# Patient Record
Sex: Male | Born: 1995 | Race: Black or African American | Hispanic: No | Marital: Single | Smoking: Current every day smoker
Health system: Southern US, Community
[De-identification: ages and names within clinical notes are randomized; demographics above are authoritative.]

---
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
## 2014-10-10 ENCOUNTER — Emergency Department (HOSPITAL_COMMUNITY)
Admission: EM | Admit: 2014-10-10 | Discharge: 2014-10-10 | Disposition: A | Discharge status: Home / Self Care | Payer: Managed Care, Other (non HMO) | Attending: Emergency Medicine | Admitting: Emergency Medicine

## 2014-10-10 ENCOUNTER — Encounter (HOSPITAL_COMMUNITY): Payer: Self-pay | Admitting: Emergency Medicine

## 2014-10-10 ENCOUNTER — Emergency Department (HOSPITAL_COMMUNITY): Payer: Managed Care, Other (non HMO)

## 2014-10-10 DIAGNOSIS — Y92410 Unspecified street and highway as the place of occurrence of the external cause: Secondary | ICD-10-CM | POA: Insufficient documentation

## 2014-10-10 DIAGNOSIS — Y9389 Activity, other specified: Secondary | ICD-10-CM | POA: Insufficient documentation

## 2014-10-10 DIAGNOSIS — S242XXA Injury of nerve root of thoracic spine, initial encounter: Secondary | ICD-10-CM | POA: Insufficient documentation

## 2014-10-10 DIAGNOSIS — S8392XA Sprain of unspecified site of left knee, initial encounter: Secondary | ICD-10-CM

## 2014-10-10 DIAGNOSIS — M546 Pain in thoracic spine: Secondary | ICD-10-CM

## 2014-10-10 DIAGNOSIS — S8992XA Unspecified injury of left lower leg, initial encounter: Secondary | ICD-10-CM | POA: Diagnosis present

## 2014-10-10 MED ORDER — NAPROXEN 500 MG PO TABS: 500.0000 mg | ORAL_TABLET | Freq: Two times a day (BID) | ORAL | Status: AC
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
## 2014-10-10 NOTE — ED Provider Notes (Signed)
Medical screening examination/treatment/procedure(s) were performed by non-physician practitioner and as supervising physician I was immediately available for consultation/collaboration.   EKG Interpretation None        Dannica Bickham F Quashon Jesus, MD 10/10/14 1859 

## 2014-10-10 NOTE — Discharge Instructions (Signed)
Ice your knee several times a day. Elevate. Knee sleep for support and compression. Try heat on your back. Follow up with orthopedics if not improving after 1 wk of rest. X-rays normal today.   Knee Sprain A knee sprain is a tear in one of the strong, fibrous tissues that connect the bones (ligaments) in your knee. The severity of the sprain depends on how much of the ligament is torn. The tear can be either partial or complete. CAUSES  Often, sprains are a result of a fall or injury. The force of the impact causes the fibers of your ligament to stretch too much. This excess tension causes the fibers of your ligament to tear. SIGNS AND SYMPTOMS  You may have some loss of motion in your knee. Other symptoms include:  Bruising.  Pain in the knee area.  Tenderness of the knee to the touch.  Swelling. DIAGNOSIS  To diagnose a knee sprain, your health care provider will physically examine your knee. Your health care provider may also suggest an X-ray exam of your knee to make sure no bones are broken. TREATMENT  If your ligament is only partially torn, treatment usually involves keeping the knee in a fixed position (immobilization) or bracing your knee for activities that require movement for several weeks. To do this, your health care provider will apply a bandage, cast, or splint to keep your knee from moving and to support your knee during movement until it heals. For a partially torn ligament, the healing process usually takes 4-6 weeks. If your ligament is completely torn, depending on which ligament it is, you may need surgery to reconnect the ligament to the bone or reconstruct it. After surgery, a cast or splint may be applied and will need to stay on your knee for 4-6 weeks while your ligament heals. HOME CARE INSTRUCTIONS  Keep your injured knee elevated to decrease swelling.  To ease pain and swelling, apply ice to the injured area:  Put ice in a plastic bag.  Place a towel between  your skin and the bag.  Leave the ice on for 20 minutes, 2-3 times a day.  Only take medicine for pain as directed by your health care provider.  Do not leave your knee unprotected until pain and stiffness go away (usually 4-6 weeks).  If you have a cast or splint, do not allow it to get wet. If you have been instructed not to remove it, cover it with a plastic bag when you shower or bathe. Do not swim.  Your health care provider may suggest exercises for you to do during your recovery to prevent or limit permanent weakness and stiffness. SEEK IMMEDIATE MEDICAL CARE IF:  Your cast or splint becomes damaged.  Your pain becomes worse.  You have significant pain, swelling, or numbness below the cast or splint. MAKE SURE YOU:  Understand these instructions.  Will watch your condition.  Will get help right away if you are not doing well or get worse. Document Released: 12/08/2005 Document Revised: 09/28/2013 Document Reviewed: 07/20/2013 [facility] Patient Information 2015 [place], [place]. This information is not intended to replace advice given to you by your health care provider. Make sure you discuss any questions you have with your health care provider.  Muscle Strain A muscle strain is an injury that occurs when a muscle is stretched beyond its normal length. Usually a small number of muscle fibers are torn when this happens. Muscle strain is rated in degrees. First-degree strains have  the least amount of muscle fiber tearing and pain. Second-degree and third-degree strains have increasingly more tearing and pain.  Usually, recovery from muscle strain takes 1-2 weeks. Complete healing takes 5-6 weeks.  CAUSES  Muscle strain happens when a sudden, violent force placed on a muscle stretches it too far. This may occur with lifting, sports, or a fall.  RISK FACTORS Muscle strain is especially common in athletes.  SIGNS AND SYMPTOMS At the site of the muscle strain, there may  be:  Pain.  Bruising.  Swelling.  Difficulty using the muscle due to pain or lack of normal function. DIAGNOSIS  Your health care provider will perform a physical exam and ask about your medical history. TREATMENT  Often, the best treatment for a muscle strain is resting, icing, and applying cold compresses to the injured area.  HOME CARE INSTRUCTIONS   Use the PRICE method of treatment to promote muscle healing during the first 2-3 days after your injury. The PRICE method involves:  Protecting the muscle from being injured again.  Restricting your activity and resting the injured body part.  Icing your injury. To do this, put ice in a plastic bag. Place a towel between your skin and the bag. Then, apply the ice and leave it on from 15-20 minutes each hour. After the third day, switch to moist heat packs.  Apply compression to the injured area with a splint or elastic bandage. Be careful not to wrap it too tightly. This may interfere with blood circulation or increase swelling.  Elevate the injured body part above the level of your heart as often as you can.  Only take over-the-counter or prescription medicines for pain, discomfort, or fever as directed by your health care provider.  Warming up prior to exercise helps to prevent future muscle strains. SEEK MEDICAL CARE IF:   You have increasing pain or swelling in the injured area.  You have numbness, tingling, or a significant loss of strength in the injured area. MAKE SURE YOU:   Understand these instructions.  Will watch your condition.  Will get help right away if you are not doing well or get worse. Document Released: 12/08/2005 Document Revised: 09/28/2013 Document Reviewed: 07/07/2013 [facility] Patient Information 2015 [place], [place]. This information is not intended to replace advice given to you by your health care provider. Make sure you discuss any questions you have with your health care provider.
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
## 2015-07-21 ENCOUNTER — Emergency Department (HOSPITAL_COMMUNITY)
Admission: EM | Admit: 2015-07-21 | Discharge: 2015-07-21 | Disposition: A | Discharge status: Home / Self Care | Payer: Managed Care, Other (non HMO) | Attending: Emergency Medicine | Admitting: Emergency Medicine

## 2015-07-21 ENCOUNTER — Encounter (HOSPITAL_COMMUNITY): Payer: Self-pay | Admitting: Emergency Medicine

## 2015-07-21 DIAGNOSIS — K644 Residual hemorrhoidal skin tags: Secondary | ICD-10-CM | POA: Insufficient documentation

## 2015-07-21 DIAGNOSIS — Z720 Tobacco use: Secondary | ICD-10-CM | POA: Insufficient documentation

## 2015-07-21 DIAGNOSIS — Z862 Personal history of diseases of the blood and blood-forming organs and certain disorders involving the immune mechanism: Secondary | ICD-10-CM | POA: Insufficient documentation

## 2015-07-21 DIAGNOSIS — Z791 Long term (current) use of non-steroidal anti-inflammatories (NSAID): Secondary | ICD-10-CM | POA: Insufficient documentation

## 2015-07-21 DIAGNOSIS — Z880 Allergy status to penicillin: Secondary | ICD-10-CM | POA: Insufficient documentation

## 2015-07-21 MED ORDER — HYDROCORTISONE 2.5 % RE CREA: TOPICAL_CREAM | RECTAL | Status: AC
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
## 2015-07-21 NOTE — Discharge Instructions (Signed)
Hemorrhoids °Hemorrhoids are swollen veins around the rectum or anus. There are two types of hemorrhoids:  °· Internal hemorrhoids. These occur in the veins just inside the rectum. They may poke through to the outside and become irritated and painful. °· External hemorrhoids. These occur in the veins outside the anus and can be felt as a painful swelling or hard lump near the anus. °CAUSES °· Pregnancy.   °· Obesity.   °· Constipation or diarrhea.   °· Straining to have a bowel movement.   °· Sitting for long periods on the toilet. °· Heavy lifting or other activity that caused you to strain. °· Anal intercourse. °SYMPTOMS  °· Pain.   °· Anal itching or irritation.   °· Rectal bleeding.   °· Fecal leakage.   °· Anal swelling.   °· One or more lumps around the anus.   °DIAGNOSIS  °Your caregiver may be able to diagnose hemorrhoids by visual examination. Other examinations or tests that may be performed include:  °· Examination of the rectal area with a gloved hand (digital rectal exam).   °· Examination of anal canal using a small tube (scope).   °· A blood test if you have lost a significant amount of blood. °· A test to look inside the colon (sigmoidoscopy or colonoscopy). °TREATMENT °Most hemorrhoids can be treated at home. However, if symptoms do not seem to be getting better or if you have a lot of rectal bleeding, your caregiver may perform a procedure to help make the hemorrhoids get smaller or remove them completely. Possible treatments include:  °· Placing a rubber band at the base of the hemorrhoid to cut off the circulation (rubber band ligation).   °· Injecting a chemical to shrink the hemorrhoid (sclerotherapy).   °· Using a tool to burn the hemorrhoid (infrared light therapy).   °· Surgically removing the hemorrhoid (hemorrhoidectomy).   °· Stapling the hemorrhoid to block blood flow to the tissue (hemorrhoid stapling).   °HOME CARE INSTRUCTIONS  °· Eat foods with fiber, such as whole grains, beans,  nuts, fruits, and vegetables. Ask your doctor about taking products with added fiber in them (fiber supplements). °· Increase fluid intake. Drink enough water and fluids to keep your urine clear or pale yellow.   °· Exercise regularly.   °· Go to the bathroom when you have the urge to have a bowel movement. Do not wait.   °· Avoid straining to have bowel movements.   °· Keep the anal area dry and clean. Use wet toilet paper or moist towelettes after a bowel movement.   °· Medicated creams and suppositories may be used or applied as directed.   °· Only take over-the-counter or prescription medicines as directed by your caregiver.   °· Take warm sitz baths for 15-20 minutes, 3-4 times a day to ease pain and discomfort.   °· Place ice packs on the hemorrhoids if they are tender and swollen. Using ice packs between sitz baths may be helpful.   °¨ Put ice in a plastic bag.   °¨ Place a towel between your skin and the bag.   °¨ Leave the ice on for 15-20 minutes, 3-4 times a day.   °· Do not use a donut-shaped pillow or sit on the toilet for long periods. This increases blood pooling and pain.   °SEEK MEDICAL CARE IF: °· You have increasing pain and swelling that is not controlled by treatment or medicine. °· You have uncontrolled bleeding. °· You have difficulty or you are unable to have a bowel movement. °· You have pain or inflammation outside the area of the hemorrhoids. °MAKE SURE YOU: °· Understand these instructions. °·   Will watch your condition.  Will get help right away if you are not doing well or get worse. Document Released: 12/05/2000 Document Revised: 11/24/2012 Document Reviewed: 10/12/2012 [facility] Patient Information 2015 [place], [place]. This information is not intended to replace advice given to you by your health care provider. Make sure you discuss any questions you have with your health care provider.  Please monitor for new or worsening signs or symptoms, return immediately if any present.  Please follow-up with primary care provider for further evaluation and management.

## 2015-07-21 NOTE — ED Provider Notes (Signed)
History  This chart was scribed for non-physician practitioner, Eyvonne Mechanic, PA-C,working with Elwin Mocha, MD, by Karle Plumber, ED Scribe. This patient was seen in room TR09C/TR09C and the patient's care was started at 11:28 AM.  Chief Complaint  Patient presents with  . Abscess   The history is provided by the patient and medical records. No language interpreter was used.    HPI Comments:  Donald Rojas is a 19 y.o. male who presents to the Emergency Department complaining of moderate to severe, "irritating" pain from an unchanged hemorrhoid that appeared about two days ago. He has not done anything to treat the area. Touching the area makes the pain worse. He states the day before the hemorrhoid appeared he saw blood when he wiped after a bowel movement but has not seen any since. He denies alleviating factors. He denies any drainage of the area, fever, chills, night sweats, HA, abdominal pain, nausea, vomiting. He denies h/o abscesses.   History reviewed. No pertinent past medical history. History reviewed. No pertinent past surgical history. No family history on file. History  Substance Use Topics  . Smoking status: Current Every Day Smoker  . Smokeless tobacco: Not on file  . Alcohol Use: Yes    Review of Systems  All other systems reviewed and are negative.   Allergies  Penicillins  Home Medications   Prior to Admission medications   Medication Sig Start Date End Date Taking? Authorizing Provider  hydrocortisone (ANUSOL-HC) 2.5 % rectal cream Apply rectally 2 times daily 07/21/15   Eyvonne Mechanic, PA-C  naproxen (NAPROSYN) 500 MG tablet Take 1 tablet (500 mg total) by mouth 2 (two) times daily. 10/10/14   Tatyana Kirichenko, PA-C   Triage Vitals: BP 138/80 mmHg  Pulse 75  Temp(Src) 97.9 F (36.6 C) (Oral)  Resp 17  Ht  (1.753 m)  Wt 203 lb (92.08 kg)  BMI 29.96 kg/m2  SpO2 98% Physical Exam  Constitutional: He is oriented to person, place, and  time. He appears well-developed and well-nourished.  HENT:  Head: Normocephalic and atraumatic.  Eyes: EOM are normal.  Neck: Normal range of motion.  Cardiovascular: Normal rate.   Pulmonary/Chest: Effort normal.  Genitourinary: Rectal exam shows external hemorrhoid.  Hemorrhoid at 8 o'clock position of rectum. Visible on external exam. No surrounding induration, tenderness, erythema or warmth. Hemorrhoid is tender to palpation. No bleeding.  Musculoskeletal: Normal range of motion.  Neurological: He is alert and oriented to person, place, and time.  Skin: Skin is warm and dry.  Psychiatric: He has a normal mood and affect. His behavior is normal.  Nursing note and vitals reviewed.   ED Course  Procedures (including critical care time) DIAGNOSTIC STUDIES: Oxygen Saturation is 98% on RA, normal by my interpretation.   COORDINATION OF CARE: 11:34 AM- Advised pt to take a stool softener. Pt verbalizes understanding and agrees to plan.  Medications - No data to display  Labs Review Labs Reviewed - No data to display  Imaging Review No results found.   EKG Interpretation None      MDM   Final diagnoses:  External hemorrhoid    Labs:  Imaging:  Consults:  Therapeutics:  Discharge Meds:   Assessment/Plan: Patient presents with a external hemorrhoid in the 8:00 position, nonthrombosed, no signs of bleeding. Patient given symptomatic care, instructions to follow-up with his primary care provider for further evaluation and management. Patient verbalizes understanding and agreement plans, strict return precautions given  I personally performed the services described in this documentation, which was scribed in my presence. The recorded information has been reviewed and is accurate.    Eyvonne Mechanic, PA-C 07/21/15 1700  Elwin Mocha, MD 07/22/15 [phone]

## 2019-03-11 ENCOUNTER — Ambulatory Visit: Payer: BLUE CROSS/BLUE SHIELD | Admitting: Podiatry

## 2019-03-18 ENCOUNTER — Ambulatory Visit: Payer: BLUE CROSS/BLUE SHIELD | Admitting: Podiatry

## 2019-08-11 ENCOUNTER — Encounter: Payer: Self-pay | Admitting: Podiatry

## 2019-08-11 ENCOUNTER — Other Ambulatory Visit: Payer: Self-pay

## 2019-08-11 ENCOUNTER — Ambulatory Visit: Payer: BLUE CROSS/BLUE SHIELD | Admitting: Podiatry

## 2019-08-11 VITALS — BP 131/93 | HR 79 | Temp 97.8°F

## 2019-08-11 DIAGNOSIS — L600 Ingrowing nail: Secondary | ICD-10-CM

## 2019-08-11 MED ORDER — NEOMYCIN-POLYMYXIN-HC 3.5-10000-1 OT SOLN: OTIC | 0 refills | Status: AC

## 2019-08-11 NOTE — Progress Notes (Signed)
Subjective:   Patient ID: Donald Rojas, male   DOB: 23 y.o.   MRN: [id number]   HPI Patient presents stating my right big toenail has been killing me and is been going on for a while I tried to trim it and soak it without relief of symptoms   Review of Systems  All other systems reviewed and are negative.       Objective:  Physical Exam Vitals signs and nursing note reviewed.  Constitutional:      Appearance: He is well-developed.  Pulmonary:     Effort: Pulmonary effort is normal.  Musculoskeletal: Normal range of motion.  Skin:    General: Skin is warm.  Neurological:     Mental Status: He is alert.     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  Explained procedure risk and patient wants procedure and today I infiltrated the right hallux 60 mg like Marcaine mixture explaining risk first and he signed consent form and I then went ahead and I remove the medial border I exposed matrix and applied phenol 3 applications 30 seconds followed by alcohol lavage sterile dressing and gave instructions on soaks and leave dressing on for 24 hours but take it off earlier if any issues were to occur.  Drops were dispensed and instructions on calling us with any issues

## 2019-08-11 NOTE — Patient Instructions (Signed)
